# Patient Record
Sex: Female | Born: 1975 | Race: White | Hispanic: Yes | Marital: Single | Smoking: Former smoker
Health system: Southern US, Community
[De-identification: ages and names within clinical notes are randomized; demographics above are authoritative.]

## PROBLEM LIST (undated history)

## (undated) DIAGNOSIS — E119 Type 2 diabetes mellitus without complications: Secondary | ICD-10-CM

## (undated) HISTORY — PX: LAPAROSCOPIC OOPHERECTOMY: SHX6507

---

## 2009-08-27 ENCOUNTER — Ambulatory Visit (HOSPITAL_COMMUNITY): Admission: RE | Admit: 2009-08-27 | Discharge: 2009-08-27 | Payer: Self-pay | Admitting: Family Medicine

## 2010-05-08 ENCOUNTER — Ambulatory Visit (HOSPITAL_COMMUNITY): Admission: RE | Admit: 2010-05-08 | Discharge: 2010-05-08 | Payer: Self-pay | Admitting: Family Medicine

## 2010-10-16 IMAGING — US US TRANSVAGINAL NON-OB
1 series · 14 of 25 positions shown · non-contrast
Comparison: August 27, 2009

CLINICAL DATA: Follow-up cysts



[Series 1: us transvaginal non-ob · 0.21mm/px · 14 of 36 slices shown]
[im 1/36]
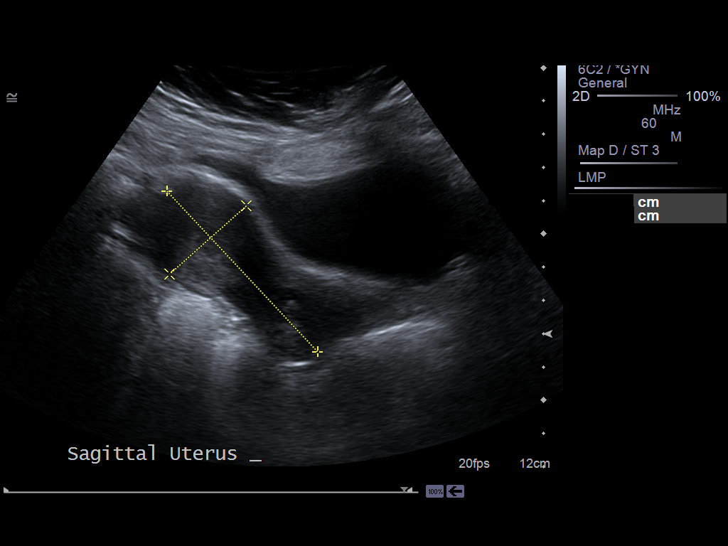
[im 3/36]
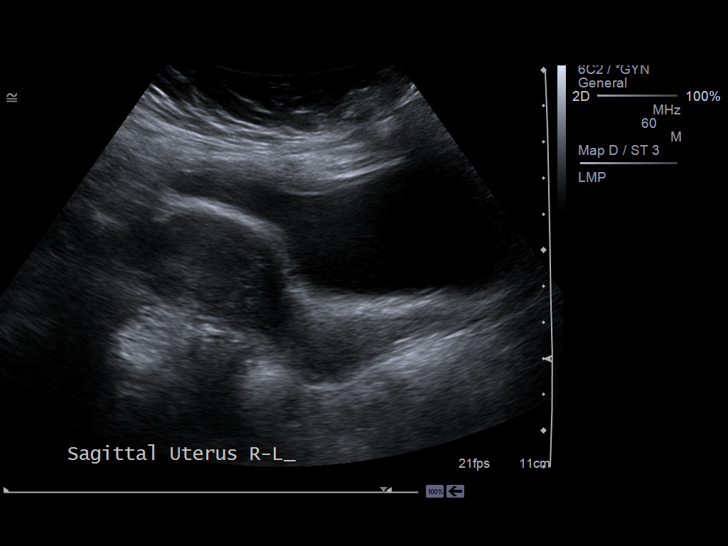
[im 6/36]
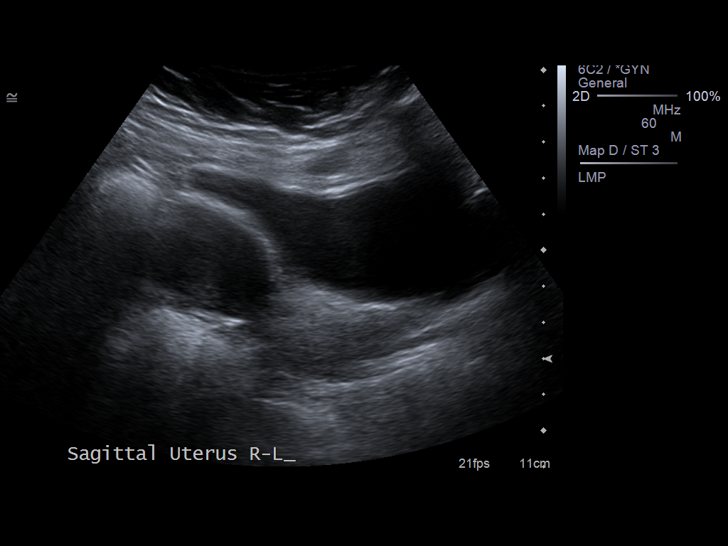
[im 9/36]
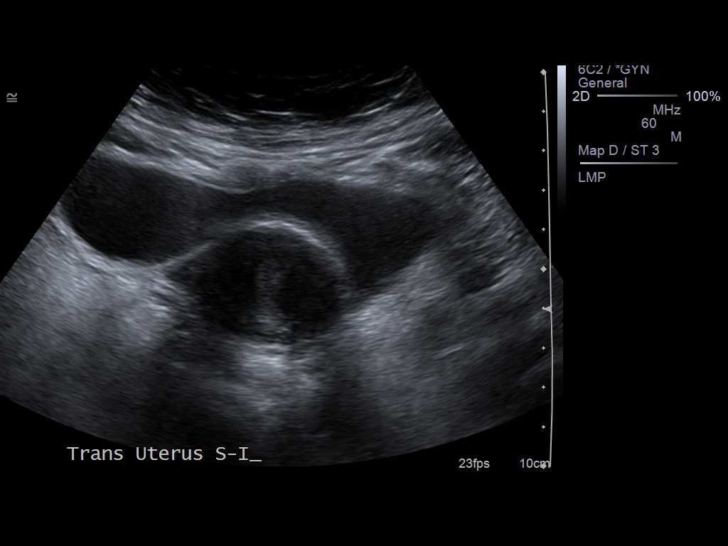
[im 12/36]
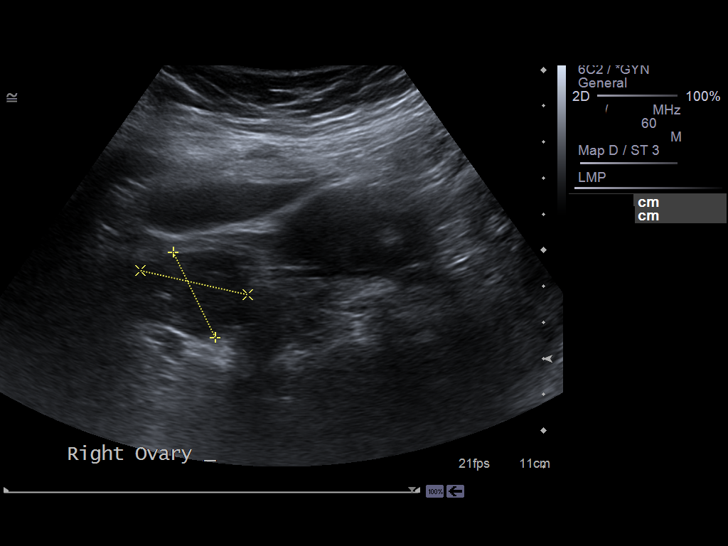
[im 14/36]
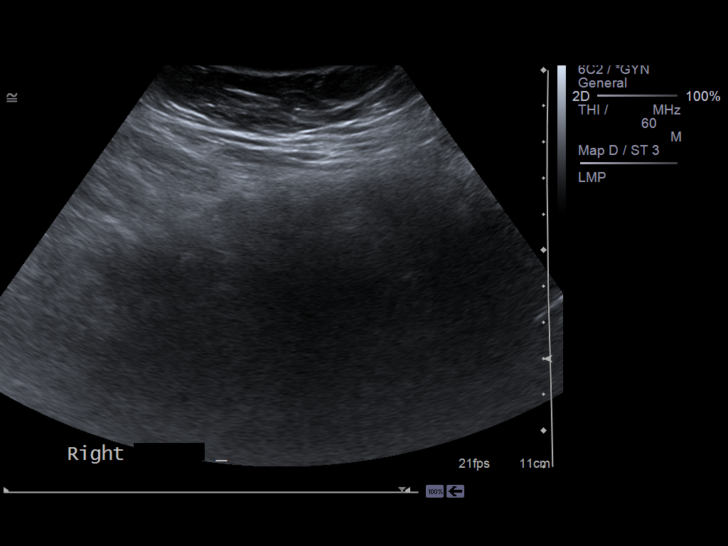
[im 17/36]
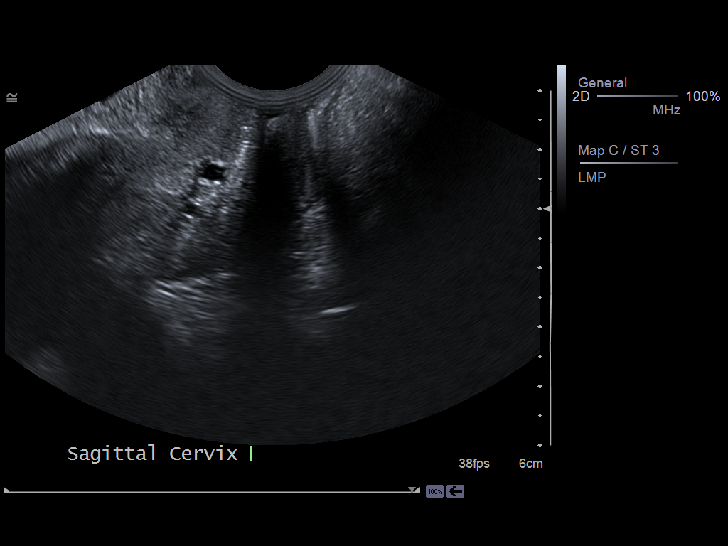
[im 19/36]
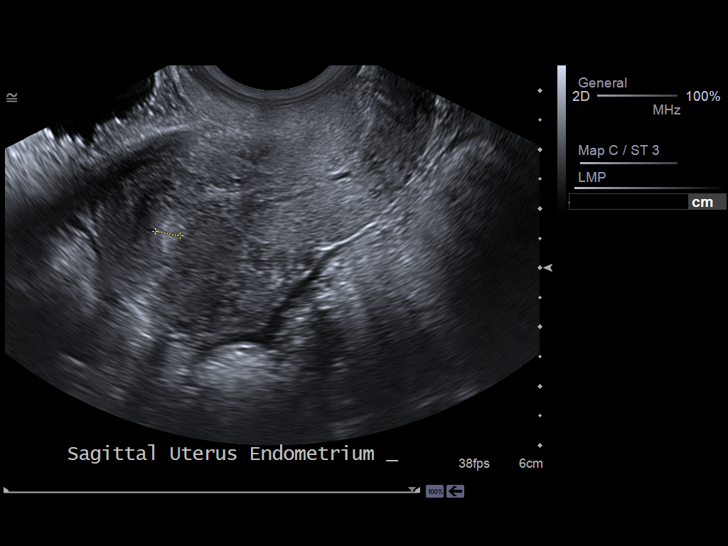
[im 22/36]
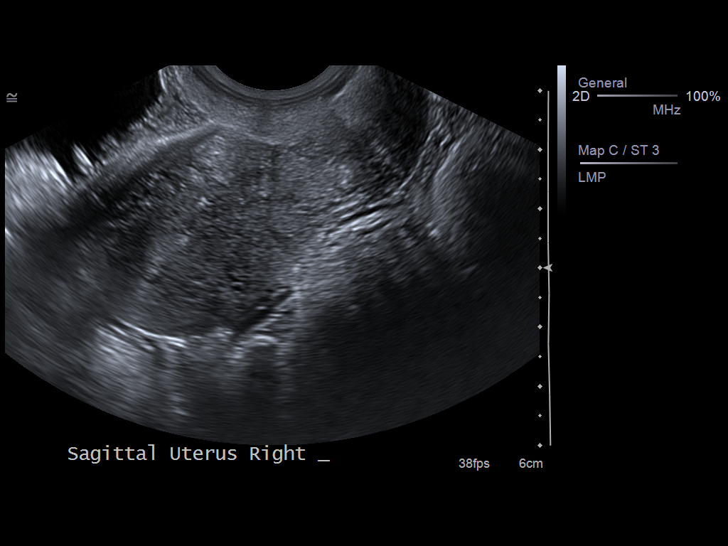
[im 24/36]
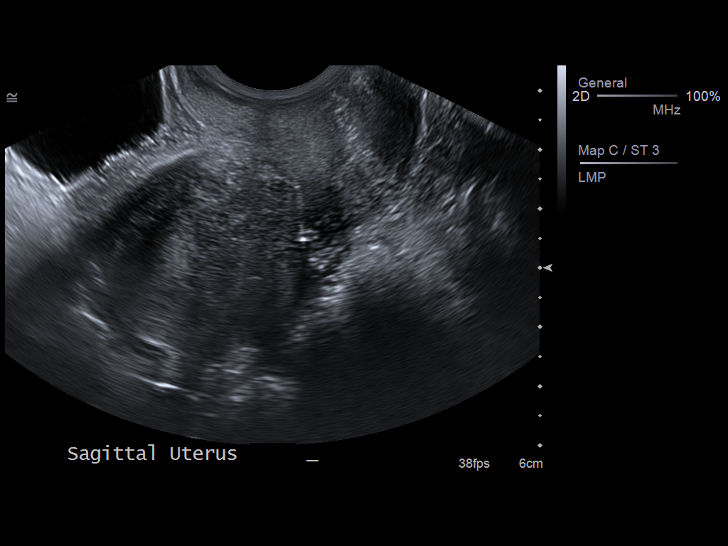
[im 27/36]
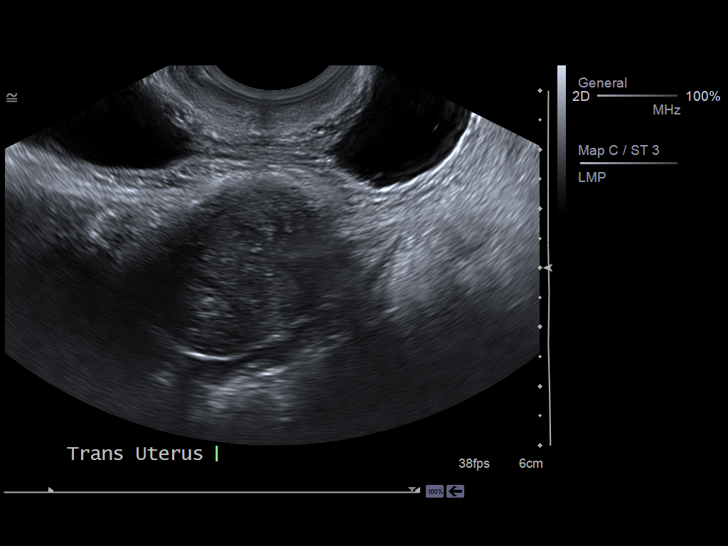
[im 30/36]
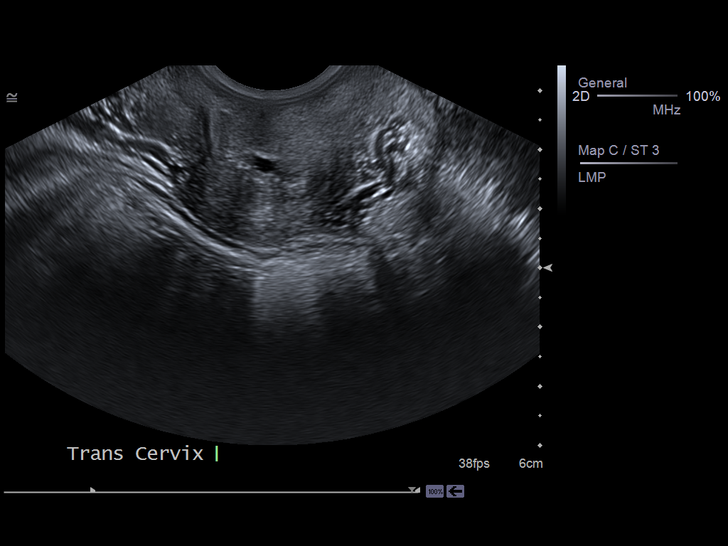
[im 33/36]
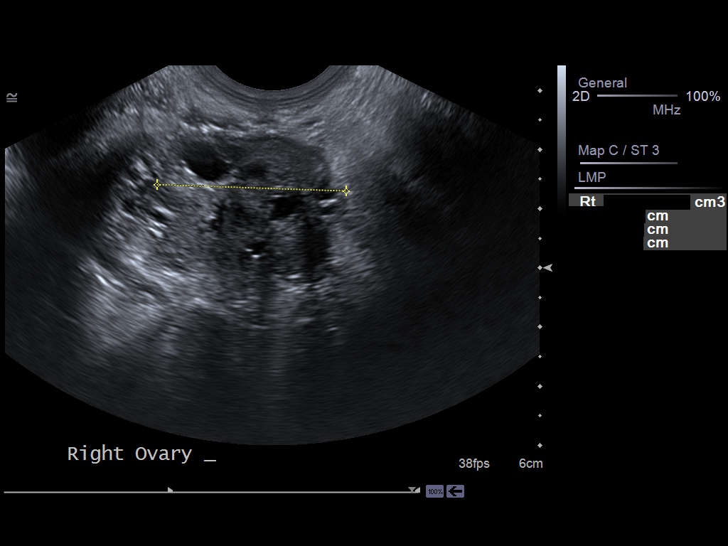
[im 36/36]
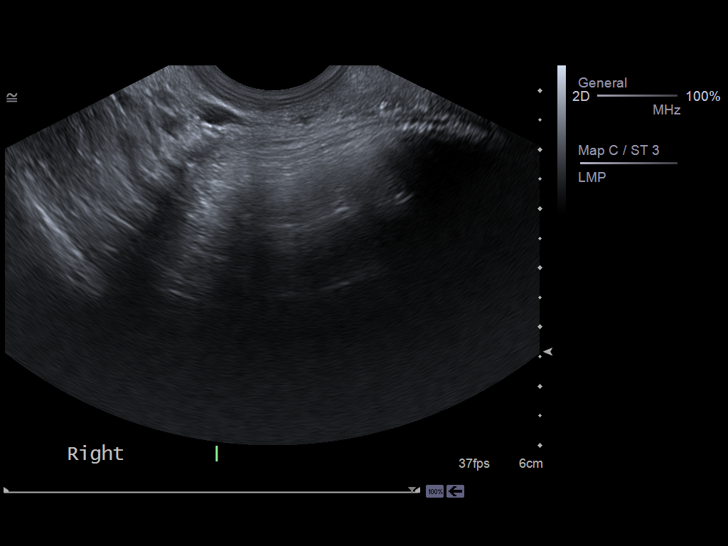

[14 of 25 positions shown; findings below may reference images not displayed]

FINDINGS: The uterus has a normal size and echotexture, measuring
3.9 x 6.3 x 3.3 cm.  The endometrial stripe is thin and
homogeneous, measuring 4 mm in width.

The right ovary has a normal size and appearance.  The right
ovarian dimensions are 4.5 x 3.1 x 3.2 cm.  The previously seen
hemorrhagic cyst on the right ovary has resolved.  The left ovary
is surgically absent.  There are no adnexal masses or free pelvic
fluid.
IMPRESSION: Interval resolution of hemorrhagic right ovarian cyst.  Normal
pelvic ultrasound today.

## 2013-01-16 ENCOUNTER — Other Ambulatory Visit (HOSPITAL_COMMUNITY): Payer: Self-pay | Admitting: Nurse Practitioner

## 2013-01-22 ENCOUNTER — Ambulatory Visit (HOSPITAL_COMMUNITY): Payer: Self-pay

## 2013-01-23 ENCOUNTER — Ambulatory Visit (HOSPITAL_COMMUNITY)
Admission: RE | Admit: 2013-01-23 | Discharge: 2013-01-23 | Disposition: A | Discharge status: Home / Self Care | Payer: Self-pay | Source: Ambulatory Visit | Attending: Obstetrics and Gynecology | Admitting: Obstetrics and Gynecology

## 2013-01-23 DIAGNOSIS — Z1231 Encounter for screening mammogram for malignant neoplasm of breast: Secondary | ICD-10-CM | POA: Insufficient documentation

## 2015-05-22 ENCOUNTER — Encounter (HOSPITAL_COMMUNITY): Payer: Self-pay | Admitting: *Deleted

## 2015-05-22 ENCOUNTER — Emergency Department (HOSPITAL_COMMUNITY)
Admission: EM | Admit: 2015-05-22 | Discharge: 2015-05-22 | Disposition: A | Discharge status: Home / Self Care | Payer: Self-pay | Attending: Emergency Medicine | Admitting: Emergency Medicine

## 2015-05-22 ENCOUNTER — Emergency Department (HOSPITAL_COMMUNITY): Payer: Self-pay

## 2015-05-22 DIAGNOSIS — E1165 Type 2 diabetes mellitus with hyperglycemia: Secondary | ICD-10-CM | POA: Insufficient documentation

## 2015-05-22 DIAGNOSIS — H53149 Visual discomfort, unspecified: Secondary | ICD-10-CM | POA: Insufficient documentation

## 2015-05-22 DIAGNOSIS — G4489 Other headache syndrome: Secondary | ICD-10-CM | POA: Insufficient documentation

## 2015-05-22 HISTORY — DX: Type 2 diabetes mellitus without complications: E11.9

## 2015-05-22 MED ORDER — OXYCODONE-ACETAMINOPHEN 5-325 MG PO TABS: 1.0000 | ORAL_TABLET | ORAL | Status: DC | PRN

## 2015-05-22 MED ORDER — KETOROLAC TROMETHAMINE 60 MG/2ML IM SOLN
60.0000 mg | Freq: Once | INTRAMUSCULAR | Status: AC
  Administered 2015-05-22: 60 mg via INTRAMUSCULAR
  Filled 2015-05-22: qty 2

## 2015-05-22 MED ORDER — IBUPROFEN 600 MG PO TABS: 600.0000 mg | ORAL_TABLET | Freq: Four times a day (QID) | ORAL | Status: DC | PRN

## 2015-05-22 NOTE — ED Provider Notes (Signed)
CSN: [id number]     Arrival date & time 05/22/15  1449 History   First MD Initiated Contact with Patient 05/22/15 1955     Chief Complaint  Patient presents with  . Headache     (Consider location/radiation/quality/duration/timing/severity/associated sxs/prior Treatment) HPI Comments: Patient here complaining of head pain and neck discomfort 3 days. Notes some photophobia without fever or chills. Denies any meningismal symptoms. Patient notes that she does heavy lifting and that symptoms are worse with lifting. Denies any vomiting. No recent history of insect bites. Pain is localized to the frontal portion of her head and radiates to her neck. She has had some right arm numbness as well 2. Denies any ataxia. No abdominal pain or chest pain. No rashes. Has used Tylenol with limited relief.  Patient is a 39 y.o. female presenting with headaches. The history is provided by the patient.  Headache   Past Medical History  Diagnosis Date  . Diabetes mellitus without complication     pre-diabetes   Past Surgical History  Procedure Laterality Date  . Cesarean section    . Laparoscopic oopherectomy Left    No family history on file. History  Substance Use Topics  . Smoking status: Never Smoker   . Smokeless tobacco: Not on file  . Alcohol Use: No   OB History    No data available     Review of Systems  Neurological: Positive for headaches.  All other systems reviewed and are negative.     Allergies  Review of patient's allergies indicates no known allergies.  Home Medications   Prior to Admission medications   Medication Sig Start Date End Date Taking? Authorizing Provider  acetaminophen (TYLENOL) 325 MG tablet Take 650 mg by mouth every 6 (six) hours as needed for mild pain or headache.   Yes Historical Provider, MD   BP 146/88 mmHg  Pulse 80  Temp(Src) 98.7 F (37.1 C) (Oral)  Resp 16  SpO2 100%  LMP 05/09/2015 Physical Exam  Constitutional: She is oriented to  person, place, and time. She appears well-developed and well-nourished.  Non-toxic appearance. No distress.  HENT:  Head: Normocephalic and atraumatic.  Eyes: Conjunctivae, EOM and lids are normal. Pupils are equal, round, and reactive to light.  Neck: Normal range of motion. Neck supple. No tracheal deviation present. No thyroid mass present.    Cardiovascular: Normal rate, regular rhythm and normal heart sounds.  Exam reveals no gallop.   No murmur heard. Pulmonary/Chest: Effort normal and breath sounds normal. No stridor. No respiratory distress. She has no decreased breath sounds. She has no wheezes. She has no rhonchi. She has no rales.  Abdominal: Soft. Normal appearance and bowel sounds are normal. She exhibits no distension. There is no tenderness. There is no rebound and no CVA tenderness.  Musculoskeletal: Normal range of motion. She exhibits no edema or tenderness.  Neurological: She is alert and oriented to person, place, and time. She has normal strength. No cranial nerve deficit or sensory deficit. GCS eye subscore is 4. GCS verbal subscore is 5. GCS motor subscore is 6.  Skin: Skin is warm and dry. No abrasion and no rash noted.  Psychiatric: She has a normal mood and affect. Her speech is normal and behavior is normal.  Nursing note and vitals reviewed.   ED Course  Procedures (including critical care time) Labs Review Labs Reviewed  I-STAT CHEM 8, ED    Imaging Review Ct Head Wo Contrast  05/22/2015   CLINICAL DATA:  Headache, numbness  EXAM: CT HEAD WITHOUT CONTRAST  TECHNIQUE: Contiguous axial images were obtained from the base of the skull through the vertex without intravenous contrast.  COMPARISON:  None.  FINDINGS: There is no evidence of mass effect, midline shift or extra-axial fluid collections. There is no evidence of a space-occupying lesion or intracranial hemorrhage. There is no evidence of a cortical-based area of acute infarction.  The ventricles and sulci  are appropriate for the patient's age. The basal cisterns are patent.  Visualized portions of the orbits are unremarkable. The visualized portions of the paranasal sinuses and mastoid air cells are unremarkable.  The osseous structures are unremarkable.  IMPRESSION: Normal CT of the brain without intravenous contrast.   Electronically Signed   By: Elige Ko   On: 05/22/2015 17:05     EKG Interpretation None      MDM   Final diagnoses:  None    Patient given Toradol here feels better. Her i-STAT 8 does show hyperglycemia. Patient notes that she is prediabetic. Has an appointment scheduled with her Dr. for 1 week. Suspect that this is musculoskeletal in nature. No evidence of meningitis.    Lorre Nick, MD 05/22/15 2153

## 2015-05-22 NOTE — ED Notes (Signed)
Allen, MD at bedside

## 2015-05-22 NOTE — Discharge Instructions (Signed)

## 2015-05-22 NOTE — ED Notes (Signed)
Pt c/o lump to back of head with pain that radiates down spine, and frontal headache.  Pt also states numbness to R arm, that has markedly increased in the last 3 days.

## 2015-05-23 LAB — I-STAT CHEM 8, ED
BUN: 9 mg/dL (ref 6–20)
CHLORIDE: 100 mmol/L — AB (ref 101–111)
CREATININE: 0.7 mg/dL (ref 0.44–1.00)
Calcium, Ion: 1.14 mmol/L (ref 1.12–1.23)
Glucose, Bld: 179 mg/dL — ABNORMAL HIGH (ref 65–99)
HCT: 42 % (ref 36.0–46.0)
Hemoglobin: 14.3 g/dL (ref 12.0–15.0)
Potassium: 3.9 mmol/L (ref 3.5–5.1)
SODIUM: 138 mmol/L (ref 135–145)
TCO2: 24 mmol/L (ref 0–100)

## 2019-08-21 ENCOUNTER — Other Ambulatory Visit (HOSPITAL_COMMUNITY): Payer: Self-pay | Admitting: *Deleted

## 2019-08-21 DIAGNOSIS — Z1231 Encounter for screening mammogram for malignant neoplasm of breast: Secondary | ICD-10-CM

## 2019-10-30 ENCOUNTER — Ambulatory Visit (HOSPITAL_COMMUNITY): Payer: Self-pay

## 2020-02-01 ENCOUNTER — Other Ambulatory Visit: Payer: Self-pay

## 2020-02-01 DIAGNOSIS — Z1231 Encounter for screening mammogram for malignant neoplasm of breast: Secondary | ICD-10-CM

## 2020-02-28 ENCOUNTER — Ambulatory Visit: Payer: Self-pay

## 2020-04-30 ENCOUNTER — Other Ambulatory Visit: Payer: Self-pay

## 2020-04-30 DIAGNOSIS — Z1231 Encounter for screening mammogram for malignant neoplasm of breast: Secondary | ICD-10-CM

## 2020-05-27 ENCOUNTER — Ambulatory Visit: Payer: Self-pay | Admitting: *Deleted

## 2020-05-27 ENCOUNTER — Ambulatory Visit
Admission: RE | Admit: 2020-05-27 | Discharge: 2020-05-27 | Disposition: A | Discharge status: Home / Self Care | Payer: No Typology Code available for payment source | Source: Ambulatory Visit | Attending: Obstetrics and Gynecology | Admitting: Obstetrics and Gynecology

## 2020-05-27 ENCOUNTER — Other Ambulatory Visit: Payer: Self-pay

## 2020-05-27 VITALS — BP 126/84 | Temp 97.7°F | Wt 197.0 lb

## 2020-05-27 DIAGNOSIS — Z1231 Encounter for screening mammogram for malignant neoplasm of breast: Secondary | ICD-10-CM

## 2020-05-27 DIAGNOSIS — Z1239 Encounter for other screening for malignant neoplasm of breast: Secondary | ICD-10-CM

## 2020-05-27 NOTE — Progress Notes (Signed)
[name] is a 44 y.o. female who presents to [facility] clinic today with no complaints.    Pap Smear: Pap smear not completed today. Last Pap smear was 08/2018 at [facility] and was normal with negative HPV. Per patient has no history of an abnormal Pap smear. Last Pap smear result is available in Epic.   Physical exam: Breasts Breasts asymmetrical, R > L but patient states this is normal for her. No skin abnormalities bilateral breasts. No nipple retraction bilateral breasts. No nipple discharge bilateral breasts. No lymphadenopathy. No lumps palpated bilateral breasts. Slight nipple inversion bilaterally but patient states this is normal for her. No complaints of pain or tenderness on exam.     Pelvic/Bimanual Pap is not indicated today.    Smoking History: Patient has never smoked.    Patient Navigation: Patient education provided. Access to services provided for patient through [facility]CP program. Natale Lay, Spanish interpreter provided through [facility].    Breast and Cervical Cancer Risk Assessment: Patient does have family history of breast cancer in her sister. No known genetic mutations, or radiation treatment to the chest before age 46. Patient does not have history of cervical dysplasia, immunocompromised, or DES exposure in-utero.  Risk Assessment    Risk Scores      05/27/2020   Last edited by: Narda Rutherford, LPN   5-year risk: 0.9 %   Lifetime risk: 13.3 %          A: BCCCP exam without pap smear No complaints today.  P: Referred patient to the [facility] Center of [facility] for a screening mammogram. Appointment scheduled May 27, 2020 at 2:00pm.  Mila Homer, RN, FNP student 05/27/2020 2:24 PM   Attestation of Supervision of Student:  I confirm that I have verified the information documented in the nurse practitioner student's note and that I have also personally reperformed the history, physical exam and all medical  decision making activities.  I have verified that all services and findings are accurately documented in this student's note; and I agree with management and plan as outlined in the documentation. I have also made any necessary editorial changes.  Brannock, Kathaleen Maser, RN [facility] [employer], [facility] 05/27/2020 2:49 PM

## 2020-05-27 NOTE — Patient Instructions (Addendum)
Informed Paula Richardson about breast self-awareness. Patient did not need a Pap smear today due to last Pap smear was in 2019 per patient. Let her know BCCCP will cover Pap smears and HPV typing every 5 years unless has a history of abnormal Pap smears. Referred patient to the Breast Center of [facility] mobile unit for screening mammogram. Appointment scheduled for May 27, 2020 at 2:00pm. Patient aware of appointment and will be there. Let patient know the Breast Center will follow up with her within the next couple weeks with results of her mammogram by letter or phone. [name] verbalized understanding.  Mila Homer, RN, FNP student 2:32 PM

## 2020-06-02 ENCOUNTER — Other Ambulatory Visit: Payer: Self-pay | Admitting: Obstetrics and Gynecology

## 2020-06-02 DIAGNOSIS — R928 Other abnormal and inconclusive findings on diagnostic imaging of breast: Secondary | ICD-10-CM

## 2020-06-16 ENCOUNTER — Ambulatory Visit
Admission: RE | Admit: 2020-06-16 | Discharge: 2020-06-16 | Disposition: A | Discharge status: Home / Self Care | Payer: No Typology Code available for payment source | Source: Ambulatory Visit | Attending: Obstetrics and Gynecology | Admitting: Obstetrics and Gynecology

## 2020-06-16 ENCOUNTER — Other Ambulatory Visit: Payer: Self-pay

## 2020-06-16 ENCOUNTER — Other Ambulatory Visit: Payer: Self-pay | Admitting: Obstetrics and Gynecology

## 2020-06-16 DIAGNOSIS — N6310 Unspecified lump in the right breast, unspecified quadrant: Secondary | ICD-10-CM

## 2020-06-16 DIAGNOSIS — R928 Other abnormal and inconclusive findings on diagnostic imaging of breast: Secondary | ICD-10-CM

## 2020-06-20 ENCOUNTER — Ambulatory Visit
Admission: RE | Admit: 2020-06-20 | Discharge: 2020-06-20 | Disposition: A | Discharge status: Home / Self Care | Payer: No Typology Code available for payment source | Source: Ambulatory Visit | Attending: Obstetrics and Gynecology | Admitting: Obstetrics and Gynecology

## 2020-06-20 ENCOUNTER — Other Ambulatory Visit: Payer: Self-pay

## 2020-06-20 DIAGNOSIS — N6310 Unspecified lump in the right breast, unspecified quadrant: Secondary | ICD-10-CM

## 2020-06-20 HISTORY — PX: BREAST BIOPSY: SHX20

## 2021-06-03 ENCOUNTER — Other Ambulatory Visit: Payer: Self-pay | Admitting: Obstetrics and Gynecology

## 2021-06-03 DIAGNOSIS — Z1231 Encounter for screening mammogram for malignant neoplasm of breast: Secondary | ICD-10-CM

## 2021-06-09 ENCOUNTER — Ambulatory Visit: Payer: No Typology Code available for payment source

## 2021-06-18 ENCOUNTER — Ambulatory Visit: Payer: No Typology Code available for payment source

## 2021-07-08 ENCOUNTER — Other Ambulatory Visit: Payer: Self-pay | Admitting: Obstetrics and Gynecology

## 2021-07-08 DIAGNOSIS — Z1231 Encounter for screening mammogram for malignant neoplasm of breast: Secondary | ICD-10-CM

## 2021-07-14 ENCOUNTER — Encounter (INDEPENDENT_AMBULATORY_CARE_PROVIDER_SITE_OTHER): Payer: Self-pay

## 2021-07-14 ENCOUNTER — Ambulatory Visit: Payer: Self-pay | Admitting: *Deleted

## 2021-07-14 ENCOUNTER — Other Ambulatory Visit: Payer: Self-pay

## 2021-07-14 ENCOUNTER — Ambulatory Visit
Admission: RE | Admit: 2021-07-14 | Discharge: 2021-07-14 | Disposition: A | Discharge status: Home / Self Care | Payer: No Typology Code available for payment source | Source: Ambulatory Visit | Attending: Obstetrics and Gynecology | Admitting: Obstetrics and Gynecology

## 2021-07-14 VITALS — BP 120/76 | Wt 200.2 lb

## 2021-07-14 DIAGNOSIS — Z1231 Encounter for screening mammogram for malignant neoplasm of breast: Secondary | ICD-10-CM

## 2021-07-14 DIAGNOSIS — Z1239 Encounter for other screening for malignant neoplasm of breast: Secondary | ICD-10-CM

## 2021-07-14 NOTE — Patient Instructions (Signed)
Explained breast self awareness with Paula Richardson. Patient did not need a Pap smear today due to last Pap smear and HPV typing was 08/23/2018. Let her know BCCCP will cover Pap smears and HPV typing every 5 years unless has a history of abnormal Pap smears. Referred patient to the [facility] Center of [facility] for a screening mammogram on mobile unit. Appointment scheduled Tuesday, July 14, 2021 at 1140. Patient escorted to the mobile unit following BCCCP appointment for her screening mammogram. Let patient know the Breast Center will follow up with her within the next couple weeks with results of her mammogram by letter or phone. [name] verbalized understanding.  Hendrik Donath, Kathaleen Maser, RN 11:21 AM

## 2021-07-14 NOTE — Progress Notes (Signed)
[name] Paula Richardson is a 45 y.o. female who presents to [facility] clinic today with no complaints.    Pap Smear: Pap smear not completed today. Last Pap smear was 08/23/2018 at [facility] clinic and was normal with negative HPV. Per patient has no history of an abnormal Pap smear. Last Pap smear result is available in Epic.   Physical exam: Breasts Right breast slightly larger than left breast that per patient is normal for her. No skin abnormalities bilateral breasts. Slight nipple inversion bilateral breasts that per patient is normal for her. No nipple discharge bilateral breasts. No lymphadenopathy. No lumps palpated bilateral breasts. No complaints of pain or tenderness on exam.  MS DIGITAL SCREENING TOMO BILATERAL  Result Date: 05/30/2020 CLINICAL DATA:  Screening. EXAM: DIGITAL SCREENING BILATERAL MAMMOGRAM WITH TOMO AND CAD COMPARISON:  Previous exam(s). ACR Breast Density Category c: The breast tissue is heterogeneously dense, which may obscure small masses. FINDINGS: In the right breast, a possible mass warrants further evaluation. In the left breast, no findings suspicious for malignancy. Images were processed with CAD. IMPRESSION: Further evaluation is suggested for possible mass in the right breast. RECOMMENDATION: Diagnostic mammogram and possibly ultrasound of the right breast. (Code:[id number]-64M) The patient will be contacted regarding the findings, and additional imaging will be scheduled. BI-RADS CATEGORY  0: Incomplete. Need additional imaging evaluation and/or prior mammograms for comparison. Electronically Signed   By: Norva Pavlov M.D.   On: 05/30/2020 10:33   MS DIGITAL DIAG TOMO UNI RIGHT  Result Date: 06/16/2020 CLINICAL DATA:  Patient returns after screening study for evaluation of possible RIGHT breast mass. EXAM: DIGITAL DIAGNOSTIC RIGHT MAMMOGRAM WITH CAD ULTRASOUND RIGHT BREAST COMPARISON:  05/27/2020, 01/23/2013 ACR Breast Density Category c:  The breast tissue is heterogeneously dense, which may obscure small masses. FINDINGS: Additional 2-D and 3-D images are performed. These views show persistent asymmetry without discrete mass in the superficial UPPER OUTER QUADRANT of the RIGHT breast. Mammographic images were processed with CAD. On physical exam, I palpate no abnormality in the UPPER-OUTER QUADRANT of the RIGHT breast. Targeted ultrasound is performed, showing focal fibrocystic changes in the 10 o'clock location of the RIGHT breast 9 centimeters from the nipple, likely accounting for the mammographic abnormality. In the 10 o'clock location 5 centimeters from nipple, there is an oval mass with indistinct margins measuring 0.9 x 0.9 x 0.4 centimeters. No internal blood flow identified on Doppler evaluation. Evaluation of the RIGHT axilla is negative for adenopathy. IMPRESSION: Indeterminate mass in the 10 o'clock location of the RIGHT breast 5 centimeters from the nipple. Tissue diagnosis is recommended. No RIGHT axillary adenopathy Benign fibrocystic changes in the UPPER-OUTER QUADRANT of the RIGHT breast. RECOMMENDATION: Ultrasound-guided core biopsy of mass in the 10 o'clock location of the RIGHT breast. I have discussed the findings and recommendations with the patient. If applicable, a reminder letter will be sent to the patient regarding the next appointment. BI-RADS CATEGORY  4: Suspicious. Electronically Signed   By: Norva Pavlov M.D.   On: 06/16/2020 09:48   MM CLIP PLACEMENT RIGHT  Result Date: 06/20/2020 CLINICAL DATA:  Status post ultrasound-guided core biopsy of a right breast mass. EXAM: 3D DIAGNOSTIC RIGHT MAMMOGRAM POST ULTRASOUND BIOPSY COMPARISON:  Previous exam(s). FINDINGS: 3D Mammographic images were obtained following ultrasound guided biopsy of a mass in the 10 o'clock region of the right breast. The biopsy marking clip is in expected location in the upper-outer quadrant of the right breast. IMPRESSION: Appropriate  positioning  of the ribbon shaped biopsy marking clip at the site of biopsy in the upper-outer quadrant of the right breast. Final Assessment: Post Procedure Mammograms for Marker Placement Electronically Signed   By: Baird Lyons M.D.   On: 06/20/2020 10:10        Pelvic/Bimanual Pap is not indicated today per BCCCP guidelines.   Smoking History: Patient is a former smoker that quit in 2007.   Patient Navigation: Patient education provided. Access to services provided for patient through [place] program. Spanish interpreter Natale Lay from [facility] provided.    Breast and Cervical Cancer Risk Assessment: Patient does have family history of breast cancer in her sister. No known genetic mutations or history of radiation treatment to the chest before age 63. Patient does not have history of cervical dysplasia, immunocompromised, or DES exposure in-utero.  Risk Assessment     Risk Scores       07/14/2021 05/27/2020   Last edited by: Narda Rutherford, LPN McGill, Sherie Demetrius Charity, LPN   5-year risk: 1.5 % 0.9 %   Lifetime risk: 15.9 % 13.3 %            A: BCCCP exam without pap smear No complaints.  P: Referred patient to the Breast Center of [facility] for a screening mammogram on mobile unit. Appointment scheduled Tuesday, July 14, 2021 at 1140.  Paula Heidelberg, RN 07/14/2021 11:21 AM

## 2023-01-06 ENCOUNTER — Other Ambulatory Visit: Payer: Self-pay | Admitting: Obstetrics and Gynecology

## 2023-01-06 DIAGNOSIS — Z1231 Encounter for screening mammogram for malignant neoplasm of breast: Secondary | ICD-10-CM

## 2023-01-27 ENCOUNTER — Ambulatory Visit: Payer: No Typology Code available for payment source

## 2023-02-24 ENCOUNTER — Ambulatory Visit: Payer: Self-pay | Admitting: Hematology and Oncology

## 2023-02-24 ENCOUNTER — Ambulatory Visit
Admission: RE | Admit: 2023-02-24 | Discharge: 2023-02-24 | Disposition: A | Discharge status: Home / Self Care | Payer: No Typology Code available for payment source | Source: Ambulatory Visit | Attending: Obstetrics and Gynecology | Admitting: Obstetrics and Gynecology

## 2023-02-24 VITALS — BP 115/75 | Ht 66.54 in | Wt 193.0 lb

## 2023-02-24 DIAGNOSIS — Z01419 Encounter for gynecological examination (general) (routine) without abnormal findings: Secondary | ICD-10-CM

## 2023-02-24 DIAGNOSIS — Z1239 Encounter for other screening for malignant neoplasm of breast: Secondary | ICD-10-CM

## 2023-02-24 DIAGNOSIS — Z1231 Encounter for screening mammogram for malignant neoplasm of breast: Secondary | ICD-10-CM

## 2023-02-24 DIAGNOSIS — Z1211 Encounter for screening for malignant neoplasm of colon: Secondary | ICD-10-CM

## 2023-02-24 NOTE — Patient Instructions (Signed)
Taught Paula Richardson about self breast awareness and gave educational materials to take home. Patient did not need a Pap smear today due to last Pap smear was in 2019 per patient. Let her know BCCCP will cover Pap smears every 5 years unless has a history of abnormal Pap smears. Referred patient to the [facility] Center of [facility] for screening mammogram. Appointment scheduled for 02/24/23. Patient aware of appointment and will be there. Let patient know will follow up with her within the next couple weeks with results. [employer] verbalized understanding.  Pascal Lux, NP 1:33 PM

## 2023-02-24 NOTE — Progress Notes (Signed)
Paula Richardson is a 47 y.o. No obstetric history on file. female who presents to [facility] clinic today with no complaints.    Pap Smear: Pap smear completed today. Last Pap smear was 2019 and was normal. Per patient has no history of an abnormal Pap smear. Last Pap smear result is not available in Epic.   Physical exam: Breasts Breasts symmetrical. No skin abnormalities bilateral breasts. No nipple retraction bilateral breasts. No nipple discharge bilateral breasts. No lymphadenopathy. No lumps palpated bilateral breasts.       Pelvic/Bimanual Ext Genitalia No lesions, no swelling and no discharge observed on external genitalia.        Vagina Vagina pink and normal texture. No lesions or discharge observed in vagina.        Cervix Cervix is present. Cervix pink and of normal texture. No discharge observed.    Uterus Uterus is present and palpable. Uterus in normal position and normal size.        Adnexae Bilateral ovaries present and palpable. No tenderness on palpation.         Rectovaginal No rectal exam completed today since patient had no rectal complaints. No skin abnormalities observed on exam.     Smoking History: Patient has is a former smoker and was not referred to quit line.    Patient Navigation: Patient education provided. Access to services provided for patient through B[facility]CP program. Natale Lay interpreter provided. No transportation provided   Colorectal Cancer Screening: Per patient has never had colonoscopy completed No complaints today. FIT test given.   Breast and Cervical Cancer Risk Assessment: Patient has family history of breast cancer with sister at age 21. Patient does not have history of cervical dysplasia, immunocompromised, or DES exposure in-utero.  Risk Scores as of 02/24/2023     Paula Richardson           5-year 2.38 %   Lifetime 21.65 %   This patient is [place]/Lat[place] but has no documented birth country, so the [place] model used data from  [place] patients to calculate their risk score. Document a birth country in the Demographics activity for a more accurate score.         Last calculated by Caprice Red, CMA on 02/24/2023 at  1:05 PM        A: BCCCP exam with pap smear No complaints with benign exam. History of 6 month irregular periods. Will refer to gynecology for evaluation.   P: Referred patient to the [facility] of [facility] for a screening mammogram. Appointment scheduled 02/24/23.  Ilda Basset A, NP 02/24/2023 1:30 PM

## 2023-03-01 ENCOUNTER — Ambulatory Visit
Admission: RE | Admit: 2023-03-01 | Discharge: 2023-03-01 | Disposition: A | Discharge status: Home / Self Care | Payer: No Typology Code available for payment source | Source: Ambulatory Visit | Attending: Obstetrics and Gynecology | Admitting: Obstetrics and Gynecology

## 2023-03-02 LAB — CYTOLOGY - PAP
Adequacy: ABSENT
Comment: NEGATIVE
Diagnosis: NEGATIVE
High risk HPV: NEGATIVE

## 2023-04-26 ENCOUNTER — Ambulatory Visit (INDEPENDENT_AMBULATORY_CARE_PROVIDER_SITE_OTHER): Payer: Self-pay | Admitting: Obstetrics & Gynecology

## 2023-04-26 ENCOUNTER — Encounter: Payer: Self-pay | Admitting: Obstetrics & Gynecology

## 2023-04-26 VITALS — BP 118/81 | HR 73 | Wt 195.1 lb

## 2023-04-26 DIAGNOSIS — N938 Other specified abnormal uterine and vaginal bleeding: Secondary | ICD-10-CM

## 2023-04-26 NOTE — Progress Notes (Signed)
Patient ID: Paula Richardson, female   DOB: March 06, 1976, 47 y.o.   MRN: [id number]  Chief Complaint  Patient presents with   abnormal cycles    HPI Paula Richardson is a 47 y.o. female.  No obstetric history on file.Patient's last menstrual period was 03/18/2023 (exact date). She has a history of abnormal irregular menses. She wonders if this is perimenopausal HPI  Past Medical History:  Diagnosis Date   Diabetes mellitus without complication (HCC)    pre-diabetes    Past Surgical History:  Procedure Laterality Date   BREAST BIOPSY Right 06/20/2020   fibroadenoma   CESAREAN SECTION     LAPAROSCOPIC OOPHERECTOMY Left     Family History  Problem Relation Age of Onset   Hypertension Mother    Diabetes Mother    Hypertension Father    Diabetes Father    Breast cancer Sister     Social History Social History   Tobacco Use   Smoking status: Former    Types: Cigarettes    Quit date: 2007    Years since quitting: 17.4   Smokeless tobacco: Never  Vaping Use   Vaping Use: Never used  Substance Use Topics   Alcohol use: Yes    Comment: occasionally   Drug use: No    No Known Allergies  Current Outpatient Medications  Medication Sig Dispense Refill   atorvastatin (LIPITOR) 10 MG tablet TAKE 1 TABLET BY MOUTH ONCE DAILY IN THE EVENING FOR 90 DAYS     glipiZIDE (GLUCOTROL) 10 MG tablet Take 1 tablet by mouth daily.     lisinopril (ZESTRIL) 5 MG tablet Take 1 tablet every day by oral route in the morning for 90 days.     metFORMIN (GLUCOPHAGE) 1000 MG tablet Take 1,000 mg by mouth 2 (two) times daily with a meal.     acetaminophen (TYLENOL) 325 MG tablet Take 650 mg by mouth every 6 (six) hours as needed for mild pain or headache. (Patient not taking: Reported on 04/26/2023)     dapagliflozin propanediol (FARXIGA) 10 MG TABS tablet Take 0.5 tablets every day by oral route. (Patient not taking: Reported on 02/24/2023)     meclizine (ANTIVERT) 25 MG tablet Take 25 mg by  mouth 3 (three) times daily as needed. (Patient not taking: Reported on 02/24/2023)     No current facility-administered medications for this visit.    Review of Systems Review of Systems  Respiratory: Negative.    Cardiovascular: Negative.   Genitourinary:  Positive for menstrual problem. Negative for pelvic pain, vaginal bleeding and vaginal discharge.    Blood pressure 118/81, pulse 73, weight 195 lb 1.6 oz (88.5 kg), last menstrual period 03/18/2023.  Physical Exam Physical Exam Vitals and nursing note reviewed.  Constitutional:      Appearance: Normal appearance.  Cardiovascular:     Rate and Rhythm: Normal rate.  Pulmonary:     Effort: Pulmonary effort is normal.  Skin:    General: Skin is warm and dry.     Findings: Bruising: .cbc.  Neurological:     General: No focal deficit present.     Mental Status: She is alert.  Psychiatric:        Mood and Affect: Mood normal.        Behavior: Behavior normal.     Data Reviewed   Assessment DUB (dysfunctional uterine bleeding) - Plan: US PELVIC COMPLETE WITH TRANSVAGINAL, Follicle stimulating hormone, TSH Rfx on Abnormal to Free T4, CBC Likely perimenopause  Plan  father/u on labs and [place]    Scheryl Darter 04/26/2023, 4:42 PM

## 2023-04-27 LAB — CBC
Hematocrit: 41.2 % (ref 34.0–46.6)
Hemoglobin: 13.7 g/dL (ref 11.1–15.9)
MCH: 29.7 pg (ref 26.6–33.0)
MCHC: 33.3 g/dL (ref 31.5–35.7)
MCV: 89 fL (ref 79–97)
Platelets: 326 10*3/uL (ref 150–450)
RBC: 4.62 x10E6/uL (ref 3.77–5.28)
RDW: 12.6 % (ref 11.7–15.4)
WBC: 8.2 10*3/uL (ref 3.4–10.8)

## 2023-04-27 LAB — TSH RFX ON ABNORMAL TO FREE T4: TSH: 1.16 u[IU]/mL (ref 0.450–4.500)

## 2023-04-27 LAB — FOLLICLE STIMULATING HORMONE: FSH: 5.3 m[IU]/mL

## 2023-05-04 ENCOUNTER — Ambulatory Visit (HOSPITAL_COMMUNITY): Payer: Self-pay | Attending: Obstetrics & Gynecology

## 2023-05-04 DIAGNOSIS — N938 Other specified abnormal uterine and vaginal bleeding: Secondary | ICD-10-CM | POA: Insufficient documentation

## 2024-02-02 ENCOUNTER — Other Ambulatory Visit: Payer: Self-pay | Admitting: Obstetrics and Gynecology

## 2024-02-02 DIAGNOSIS — Z1231 Encounter for screening mammogram for malignant neoplasm of breast: Secondary | ICD-10-CM

## 2024-04-05 ENCOUNTER — Ambulatory Visit
Admission: RE | Admit: 2024-04-05 | Discharge: 2024-04-05 | Disposition: A | Discharge status: Home / Self Care | Source: Ambulatory Visit | Attending: Obstetrics and Gynecology | Admitting: Obstetrics and Gynecology

## 2024-04-05 ENCOUNTER — Ambulatory Visit: Payer: Self-pay | Admitting: *Deleted

## 2024-04-05 VITALS — BP 137/87 | Wt 192.0 lb

## 2024-04-05 DIAGNOSIS — Z1231 Encounter for screening mammogram for malignant neoplasm of breast: Secondary | ICD-10-CM

## 2024-04-05 DIAGNOSIS — Z1239 Encounter for other screening for malignant neoplasm of breast: Secondary | ICD-10-CM

## 2024-04-05 DIAGNOSIS — Z1211 Encounter for screening for malignant neoplasm of colon: Secondary | ICD-10-CM

## 2024-04-05 NOTE — Patient Instructions (Signed)
 Explained breast self awareness with Florina Husbands. Patient did not need a Pap smear today due to last Pap smear and HPV typing was 02/24/2023. Let her know BCCCP will cover Pap smears and HPV typing every 5 years unless has a history of abnormal Pap smears. Referred patient to the [facility] Center of [facility] for a screening mammogram on mobile unit. Appointment scheduled Thursday, Apr 05, 2024 at 1430. Patient aware of appointment and will be there. Let patient know the Breast Center will follow up with her within the next couple weeks with results of mammogram by letter or phone. [employer] verbalized understanding.  Jakiah Bienaime, Dela Favor, RN 2:01 PM

## 2024-04-05 NOTE — Progress Notes (Signed)
 [name] is a 48 y.o. female who presents to [facility] clinic today with no complaints.    Pap Smear: Pap smear not completed today. Last Pap smear was 02/24/2023 at [facility] and was normal with negative HPV. Per patient has no history of an abnormal Pap smear. Last Pap smear result is available in Epic.   Physical exam: Breasts Right breast slightly larger than left breast that per patient is normal for her. No skin abnormalities bilateral breasts. Slight nipple inversion bilateral breasts that per patient is normal for her. No nipple discharge bilateral breasts. No lymphadenopathy. No lumps palpated bilateral breasts. No complaints of pain or tenderness on exam.   MS DIGITAL SCREENING TOMO BILATERAL Result Date: 03/03/2023 CLINICAL DATA:  Screening. EXAM: DIGITAL SCREENING BILATERAL MAMMOGRAM WITH TOMOSYNTHESIS AND CAD TECHNIQUE: Bilateral screening digital craniocaudal and mediolateral oblique mammograms were obtained. Bilateral screening digital breast tomosynthesis was performed. The images were evaluated with computer-aided detection. COMPARISON:  Previous exam(s). ACR Breast Density Category c: The breasts are heterogeneously dense, which may obscure small masses. FINDINGS: There are no findings suspicious for malignancy. IMPRESSION: No mammographic evidence of malignancy. A result letter of this screening mammogram will be mailed directly to the patient. RECOMMENDATION: Screening mammogram in one year. (Code:[id number]-01Y) BI-RADS CATEGORY  1: Negative. Electronically Signed   By: Catherin Closs M.D.   On: 03/03/2023 13:24   MM 3D SCREEN BREAST BILATERAL Result Date: 07/15/2021 CLINICAL DATA:  Screening. EXAM: DIGITAL SCREENING BILATERAL MAMMOGRAM WITH TOMOSYNTHESIS AND CAD TECHNIQUE: Bilateral screening digital craniocaudal and mediolateral oblique mammograms were obtained. Bilateral screening digital breast tomosynthesis was performed. The images were evaluated with computer-aided  detection. COMPARISON:  Previous exam(s). ACR Breast Density Category c: The breast tissue is heterogeneously dense, which may obscure small masses. FINDINGS: There are no findings suspicious for malignancy. IMPRESSION: No mammographic evidence of malignancy. A result letter of this screening mammogram will be mailed directly to the patient. RECOMMENDATION: Screening mammogram in one year. (Code:[id number]) BI-RADS CATEGORY  1: Negative. Electronically Signed   By: Amanda Jungling M.D.   On: 07/15/2021 15:37   MM CLIP PLACEMENT RIGHT Result Date: 06/20/2020 CLINICAL DATA:  Status post ultrasound-guided core biopsy of a right breast mass. EXAM: 3D DIAGNOSTIC RIGHT MAMMOGRAM POST ULTRASOUND BIOPSY COMPARISON:  Previous exam(s). FINDINGS: 3D Mammographic images were obtained following ultrasound guided biopsy of a mass in the 10 o'clock region of the right breast. The biopsy marking clip is in expected location in the upper-outer quadrant of the right breast. IMPRESSION: Appropriate positioning of the ribbon shaped biopsy marking clip at the site of biopsy in the upper-outer quadrant of the right breast. Final Assessment: Post Procedure Mammograms for Marker Placement Electronically Signed   By: Delwin Files M.D.   On: 06/20/2020 10:10   MS DIGITAL DIAG TOMO UNI RIGHT Result Date: 06/16/2020 CLINICAL DATA:  Patient returns after screening study for evaluation of possible RIGHT breast mass. EXAM: DIGITAL DIAGNOSTIC RIGHT MAMMOGRAM WITH CAD ULTRASOUND RIGHT BREAST COMPARISON:  05/27/2020, 01/23/2013 ACR Breast Density Category c: The breast tissue is heterogeneously dense, which may obscure small masses. FINDINGS: Additional 2-D and 3-D images are performed. These views show persistent asymmetry without discrete mass in the superficial UPPER OUTER QUADRANT of the RIGHT breast. Mammographic images were processed with CAD. On physical exam, I palpate no abnormality in the UPPER-OUTER QUADRANT of the RIGHT breast. Targeted  ultrasound is performed, showing focal fibrocystic changes in the 10 o'clock location of the RIGHT breast 9 centimeters  from the nipple, likely accounting for the mammographic abnormality. In the 10 o'clock location 5 centimeters from nipple, there is an oval mass with indistinct margins measuring 0.9 x 0.9 x 0.4 centimeters. No internal blood flow identified on Doppler evaluation. Evaluation of the RIGHT axilla is negative for adenopathy. IMPRESSION: Indeterminate mass in the 10 o'clock location of the RIGHT breast 5 centimeters from the nipple. Tissue diagnosis is recommended. No RIGHT axillary adenopathy Benign fibrocystic changes in the UPPER-OUTER QUADRANT of the RIGHT breast. RECOMMENDATION: Ultrasound-guided core biopsy of mass in the 10 o'clock location of the RIGHT breast. I have discussed the findings and recommendations with the patient. If applicable, a reminder letter will be sent to the patient regarding the next appointment. BI-RADS CATEGORY  4: Suspicious. Electronically Signed   By: Anitra Barn M.D.   On: 06/16/2020 09:48   MS DIGITAL SCREENING TOMO BILATERAL Result Date: 05/30/2020 CLINICAL DATA:  Screening. EXAM: DIGITAL SCREENING BILATERAL MAMMOGRAM WITH TOMO AND CAD COMPARISON:  Previous exam(s). ACR Breast Density Category c: The breast tissue is heterogeneously dense, which may obscure small masses. FINDINGS: In the right breast, a possible mass warrants further evaluation. In the left breast, no findings suspicious for malignancy. Images were processed with CAD. IMPRESSION: Further evaluation is suggested for possible mass in the right breast. RECOMMENDATION: Diagnostic mammogram and possibly ultrasound of the right breast. (Code:[id number]) The patient will be contacted regarding the findings, and additional imaging will be scheduled. BI-RADS CATEGORY  0: Incomplete. Need additional imaging evaluation and/or prior mammograms for comparison. Electronically Signed   By: Anitra Barn  M.D.   On: 05/30/2020 10:33    Pelvic/Bimanual Pap is not indicated today per BCCCP guidelines.   Smoking History: Patient is a former smoker that quit in 2007.    Patient Navigation: Patient education provided. Access to services provided for patient through [facility] program. Spanish interpreter Barbette Boom from [facility] provided.   Colorectal Cancer Screening: Per patient has never had colonoscopy completed. FIT Test given to patient to complete. No complaints today.    Breast and Cervical Cancer Risk Assessment: Patient has a family history of her sister having breast cancer. Patient has no known genetic mutations or history of radiation treatment to the chest before age 89. Patient does not have history of cervical dysplasia, immunocompromised, or DES exposure in-utero.   Risk Scores as of Encounter on 04/05/2024     Gregary Lean           5-year 1.99%   Lifetime 23.31%            Last calculated by Silas, Ansyi K, CMA on 04/05/2024 at  2:00 PM        A: BCCCP exam without pap smear No complaints.  P: Referred patient to the [facility] for a screening mammogram on mobile unit. Appointment scheduled Thursday, Apr 05, 2024 at 1430.  Stefan Edge, RN 04/05/2024 2:01 PM
